# Patient Record
Sex: Male | Born: 1970 | Race: Black or African American | Hispanic: No | Marital: Married | Smoking: Never smoker
Health system: Southern US, Community
[De-identification: ages and names within clinical notes are randomized; demographics above are authoritative.]

## PROBLEM LIST (undated history)

## (undated) DIAGNOSIS — M79601 Pain in right arm: Secondary | ICD-10-CM

## (undated) DIAGNOSIS — M25511 Pain in right shoulder: Secondary | ICD-10-CM

## (undated) DIAGNOSIS — M79602 Pain in left arm: Secondary | ICD-10-CM

## (undated) DIAGNOSIS — M25512 Pain in left shoulder: Secondary | ICD-10-CM

---

## 2015-10-24 ENCOUNTER — Encounter (HOSPITAL_COMMUNITY): Payer: Self-pay | Admitting: *Deleted

## 2015-10-24 ENCOUNTER — Emergency Department (INDEPENDENT_AMBULATORY_CARE_PROVIDER_SITE_OTHER)
Admission: EM | Admit: 2015-10-24 | Discharge: 2015-10-24 | Disposition: A | Discharge status: Home / Self Care | Payer: Self-pay | Source: Home / Self Care

## 2015-10-24 DIAGNOSIS — Z041 Encounter for examination and observation following transport accident: Secondary | ICD-10-CM

## 2015-10-24 MED ORDER — IBUPROFEN 800 MG PO TABS: 800.0000 mg | ORAL_TABLET | Freq: Three times a day (TID) | ORAL | Status: DC
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
## 2015-11-26 ENCOUNTER — Observation Stay (HOSPITAL_COMMUNITY)
Admission: EM | Admit: 2015-11-26 | Discharge: 2015-11-27 | Disposition: A | Discharge status: Home / Self Care | Payer: Medicaid Other | Attending: Internal Medicine | Admitting: Internal Medicine

## 2015-11-26 ENCOUNTER — Encounter (HOSPITAL_COMMUNITY): Payer: Self-pay | Admitting: *Deleted

## 2015-11-26 ENCOUNTER — Observation Stay (HOSPITAL_COMMUNITY): Payer: Medicaid Other

## 2015-11-26 DIAGNOSIS — M791 Myalgia, unspecified site: Secondary | ICD-10-CM

## 2015-11-26 DIAGNOSIS — R7989 Other specified abnormal findings of blood chemistry: Secondary | ICD-10-CM | POA: Insufficient documentation

## 2015-11-26 DIAGNOSIS — R778 Other specified abnormalities of plasma proteins: Secondary | ICD-10-CM

## 2015-11-26 DIAGNOSIS — M79601 Pain in right arm: Secondary | ICD-10-CM | POA: Diagnosis present

## 2015-11-26 DIAGNOSIS — R079 Chest pain, unspecified: Secondary | ICD-10-CM | POA: Diagnosis present

## 2015-11-26 DIAGNOSIS — R52 Pain, unspecified: Secondary | ICD-10-CM | POA: Diagnosis present

## 2015-11-26 DIAGNOSIS — H6123 Impacted cerumen, bilateral: Secondary | ICD-10-CM

## 2015-11-26 DIAGNOSIS — M79602 Pain in left arm: Secondary | ICD-10-CM

## 2015-11-26 DIAGNOSIS — I10 Essential (primary) hypertension: Secondary | ICD-10-CM | POA: Diagnosis present

## 2015-11-26 HISTORY — DX: Pain in left arm: M79.602

## 2015-11-26 HISTORY — DX: Pain in right arm: M79.601

## 2015-11-26 HISTORY — DX: Pain in left shoulder: M25.512

## 2015-11-26 HISTORY — DX: Pain in right shoulder: M25.511

## 2015-11-26 LAB — LIPID PANEL
CHOL/HDL RATIO: 6.1 ratio
Cholesterol: 270 mg/dL — ABNORMAL HIGH (ref 0–200)
HDL: 44 mg/dL (ref >40)
LDL CALC: 195 mg/dL — AB (ref 0–99)
Triglycerides: 156 mg/dL — ABNORMAL HIGH (ref <150)
VLDL: 31 mg/dL (ref 0–40)

## 2015-11-26 LAB — CBC WITH DIFFERENTIAL/PLATELET
Basophils Absolute: 0 10*3/uL (ref 0.0–0.1)
Basophils Relative: 1 %
EOS PCT: 8 %
Eosinophils Absolute: 0.5 10*3/uL (ref 0.0–0.7)
HCT: 44.9 % (ref 39.0–52.0)
HEMOGLOBIN: 15.3 g/dL (ref 13.0–17.0)
LYMPHS ABS: 3.1 10*3/uL (ref 0.7–4.0)
LYMPHS PCT: 51 %
MCH: 29.4 pg (ref 26.0–34.0)
MCHC: 34.1 g/dL (ref 30.0–36.0)
MCV: 86.2 fL (ref 78.0–100.0)
MONOS PCT: 9 %
Monocytes Absolute: 0.5 10*3/uL (ref 0.1–1.0)
NEUTROS PCT: 31 %
Neutro Abs: 1.9 10*3/uL (ref 1.7–7.7)
Platelets: 226 10*3/uL (ref 150–400)
RBC: 5.21 MIL/uL (ref 4.22–5.81)
RDW: 12.7 % (ref 11.5–15.5)
WBC: 6.1 10*3/uL (ref 4.0–10.5)

## 2015-11-26 LAB — COMPREHENSIVE METABOLIC PANEL
ALK PHOS: 52 U/L (ref 38–126)
ALT: 19 U/L (ref 17–63)
ANION GAP: 9 (ref 5–15)
AST: 33 U/L (ref 15–41)
Albumin: 3.7 g/dL (ref 3.5–5.0)
BILIRUBIN TOTAL: 1.1 mg/dL (ref 0.3–1.2)
BUN: 8 mg/dL (ref 6–20)
CALCIUM: 9.5 mg/dL (ref 8.9–10.3)
CO2: 27 mmol/L (ref 22–32)
CREATININE: 0.8 mg/dL (ref 0.61–1.24)
Chloride: 101 mmol/L (ref 101–111)
Glucose, Bld: 85 mg/dL (ref 65–99)
Potassium: 4.3 mmol/L (ref 3.5–5.1)
Sodium: 137 mmol/L (ref 135–145)
TOTAL PROTEIN: 8 g/dL (ref 6.5–8.1)

## 2015-11-26 LAB — URINALYSIS, ROUTINE W REFLEX MICROSCOPIC
BILIRUBIN URINE: NEGATIVE
Glucose, UA: NEGATIVE mg/dL
HGB URINE DIPSTICK: NEGATIVE
KETONES UR: NEGATIVE mg/dL
Leukocytes, UA: NEGATIVE
NITRITE: NEGATIVE
PROTEIN: NEGATIVE mg/dL
SPECIFIC GRAVITY, URINE: 1.007 (ref 1.005–1.030)
pH: 7.5 (ref 5.0–8.0)

## 2015-11-26 LAB — TROPONIN I
TROPONIN I: 0.04 ng/mL — AB (ref <0.031)
Troponin I: <0.03 ng/mL (ref <0.031)

## 2015-11-26 LAB — CK: CK TOTAL: 162 U/L (ref 49–397)

## 2015-11-26 MED ORDER — ONDANSETRON HCL 4 MG/2ML IJ SOLN: 4.0000 mg | Freq: Four times a day (QID) | INTRAMUSCULAR | Status: DC | PRN

## 2015-11-26 MED ORDER — ACETAMINOPHEN 325 MG PO TABS: 650.0000 mg | ORAL_TABLET | ORAL | Status: DC | PRN

## 2015-11-26 MED ORDER — KETOROLAC TROMETHAMINE 30 MG/ML IJ SOLN
30.0000 mg | Freq: Once | INTRAMUSCULAR | Status: AC
  Administered 2015-11-26: 30 mg via INTRAVENOUS
  Filled 2015-11-26: qty 1

## 2015-11-26 MED ORDER — ENOXAPARIN SODIUM 40 MG/0.4ML ~~LOC~~ SOLN: 40.0000 mg | SUBCUTANEOUS | Status: DC

## 2015-11-26 MED ORDER — IBUPROFEN 200 MG PO TABS
600.0000 mg | ORAL_TABLET | Freq: Once | ORAL | Status: AC
  Administered 2015-11-26: 600 mg via ORAL
  Filled 2015-11-26: qty 1

## 2015-11-26 MED ORDER — IBUPROFEN 600 MG PO TABS: 600.0000 mg | ORAL_TABLET | Freq: Three times a day (TID) | ORAL | Status: DC | PRN

## 2015-11-26 MED ORDER — NITROGLYCERIN 0.4 MG SL SUBL: 0.4000 mg | SUBLINGUAL_TABLET | SUBLINGUAL | Status: DC | PRN

## 2015-11-26 MED ORDER — ENOXAPARIN SODIUM 40 MG/0.4ML ~~LOC~~ SOLN
40.0000 mg | SUBCUTANEOUS | Status: DC
  Administered 2015-11-26: 40 mg via SUBCUTANEOUS
  Filled 2015-11-26: qty 0.4

## 2015-11-26 MED ORDER — GI COCKTAIL ~~LOC~~: 30.0000 mL | Freq: Three times a day (TID) | ORAL | Status: DC | PRN

## 2015-11-26 MED ORDER — SODIUM CHLORIDE 0.9 % IV SOLN
INTRAVENOUS | Status: AC
  Administered 2015-11-26: 18:00:00 via INTRAVENOUS

## 2015-11-26 MED ORDER — MORPHINE SULFATE (PF) 2 MG/ML IV SOLN: 1.0000 mg | INTRAVENOUS | Status: DC | PRN

## 2015-11-26 MED ORDER — MORPHINE SULFATE (PF) 2 MG/ML IV SOLN: 2.0000 mg | INTRAVENOUS | Status: DC | PRN

## 2015-11-26 MED ORDER — HYDROCODONE-ACETAMINOPHEN 5-325 MG PO TABS: 1.0000 | ORAL_TABLET | ORAL | Status: DC | PRN

## 2015-11-26 MED ORDER — ASPIRIN 81 MG PO CHEW
324.0000 mg | CHEWABLE_TABLET | Freq: Once | ORAL | Status: AC
  Administered 2015-11-26: 324 mg via ORAL
  Filled 2015-11-26: qty 4

## 2015-11-26 MED ORDER — HYDRALAZINE HCL 20 MG/ML IJ SOLN: 2.0000 mg | INTRAMUSCULAR | Status: DC | PRN

## 2015-11-26 MED ORDER — ALPRAZOLAM 0.25 MG PO TABS: 0.2500 mg | ORAL_TABLET | Freq: Two times a day (BID) | ORAL | Status: DC | PRN

## 2015-11-26 NOTE — ED Notes (Signed)
Pt reports generalized pain x 3 days, occurs more in neck ears and shoulders. Denies any other symptoms. No acute distress noted.

## 2015-11-26 NOTE — ED Notes (Signed)
Rep[ort called to  chrissie rn 3 w

## 2015-11-26 NOTE — ED Provider Notes (Signed)
CSN: [id number]     Arrival date & time 11/26/15  1003 History   First MD Initiated Contact with Patient 11/26/15 1227     Chief Complaint  Patient presents with  . Pain     (Consider location/radiation/quality/duration/timing/severity/associated sxs/prior Treatment) HPI Comments: Patient is a previously healthy 45 year old male who presents with muscle soreness in his arms and shoulders. The patient reports his muscle pain and soreness. The patient began a new job with heavy lifting in March. He states he has felt muscle soreness since he began, but the past 3 days have been a little worse. He rates his pain as a 5/10. The patient has tried soaking in warm water at home which has been helpful. He has not tried any medications. He states that he does not have pain when he is working, but it returns when he gets home and is at rest. The patient also reports increased earwax in pain on the inside of his ears when he has to wear ear protection at work. The patient denies any chest pain, shortness of breath, abdominal pain, nausea, vomiting, dysuria.  The history is provided by the patient. The history is limited by a language barrier. A language interpreter was used.    History reviewed. No pertinent past medical history. History reviewed. No pertinent past surgical history. History reviewed. No pertinent family history. Social History  Substance Use Topics  . Smoking status: Never Smoker   . Smokeless tobacco: None  . Alcohol Use: No    Review of Systems  Constitutional: Negative for fever and chills.  HENT: Positive for ear pain. Negative for facial swelling and sore throat.   Respiratory: Negative for shortness of breath.   Cardiovascular: Negative for chest pain.  Gastrointestinal: Negative for nausea, vomiting and abdominal pain.  Genitourinary: Negative for dysuria.  Musculoskeletal: Positive for myalgias. Negative for back pain.  Skin: Negative for rash and wound.  Neurological:  Negative for headaches.  Psychiatric/Behavioral: The patient is not nervous/anxious.       Allergies  Review of patient's allergies indicates no known allergies.  Home Medications   Prior to Admission medications   Medication Sig Start Date End Date Taking? Authorizing Provider  ibuprofen (ADVIL,MOTRIN) 600 MG tablet Take 1 tablet (600 mg total) by mouth every 8 (eight) hours as needed. 11/26/15   Axle Parfait M Aleeza [place], PA-C   BP 144/96 mmHg  Pulse 61  Temp(Src) 98.3 F (36.8 C) (Oral)  Resp 16  SpO2 97% Physical Exam  Constitutional: He appears well-developed and well-nourished. No distress.  HENT:  Head: Normocephalic and atraumatic.  Right Ear: Tympanic membrane normal.  Left Ear: Tympanic membrane normal.  Mouth/Throat: Oropharynx is clear and moist. No oropharyngeal exudate.  Large amounts of hard cerumen in bilateral ear canals; removed with curette and TMs normal bilaterally  Eyes: Conjunctivae are normal. Pupils are equal, round, and reactive to light. Right eye exhibits no discharge. Left eye exhibits no discharge. No scleral icterus.  Neck: Normal range of motion. Neck supple. No thyromegaly present.  Cardiovascular: Normal rate, regular rhythm and normal heart sounds.  Exam reveals no gallop and no friction rub.   No murmur heard. Pulmonary/Chest: Effort normal and breath sounds normal. No stridor. No respiratory distress. He has no wheezes. He has no rales.  Abdominal: Soft. Bowel sounds are normal. He exhibits no distension. There is no tenderness. There is no rebound and no guarding.  Musculoskeletal: He exhibits no edema.       Thoracic back: He  exhibits no tenderness.       Arms: Patient states he has muscle soreness in his biceps and over the area of his latissimus muscles; patient has normal range of motion, no bony tenderness; denies back pain or tenderness on palpation  Lymphadenopathy:    He has no cervical adenopathy.  Neurological: He is alert. Coordination  normal.  Skin: Skin is warm and dry. No rash noted. He is not diaphoretic. No pallor.  Psychiatric: He has a normal mood and affect.  Nursing note and vitals reviewed.   ED Course  Procedures (including critical care time) Labs Review Labs Reviewed  TROPONIN I - Abnormal; Notable for the following:    Troponin I 0.04 (*)    All other components within normal limits  CBC WITH DIFFERENTIAL/PLATELET  COMPREHENSIVE METABOLIC PANEL  CK  URINALYSIS, ROUTINE W REFLEX MICROSCOPIC (NOT AT [facility])  TROPONIN I  LIPID PANEL    Imaging Review No results found. I have personally reviewed and evaluated these images and lab results as part of my medical decision-making.   EKG Interpretation   Date/Time:  Sunday November 26 2015 14:31:14 EDT Ventricular Rate:  57 PR Interval:  177 QRS Duration: 83 QT Interval:  377 QTC Calculation: 367 R Axis:   61 Text Interpretation:  Sinus rhythm Probable left atrial enlargement  Probable left ventricular hypertrophy ST elev, probable normal early repol  pattern No previous ECGs available Confirmed by RANCOUR  MD, STEPHEN  ([id number]) on 11/26/2015 2:44:05 PM      MDM   CBC, CMP, CK unremarkable.Troponin 0.04. EKG shows NSR, probable left atrial enlargement and left ventricular hypertrophy, ST elevation, but probable normal early repol.  Patient given ibuprofen in ED. Cerumen disimpaction completed in the ED. Supportive care discussed for muscle soreness and prevention of cerumen impactions. Dr. Manus Gunningancour consulted Dr. Elvera LennoxGherghe with medcine who would like to admit the patient for observation and serial troponins. Patient also evaluated by Dr. Manus Gunningancour who is in agreement with plan. Patient understands and is in agreement with plan.   Final diagnoses:  Muscle soreness  Cerumen impaction, bilateral  Elevated troponin I level      Emi Holeslexandra M Rula [place], PA-C 11/26/15 [address]1645  Katesha Eichel M Antawn Sison, PA-C 11/26/15 1744  Glynn OctaveStephen Rancour, MD 11/26/15 2252

## 2015-11-26 NOTE — ED Notes (Signed)
Used Music therapisttranslator phone for [employer]wahili. Pt states he has pain in his arms and shoulders due to heavy lifting at his job. No injures to arms, he jsut feels weak. No obvious bruising or deformity, full range of motion noted. Grip strength strong and equal. Pt also c/o "bloody discharged" to both ears. No drainaged noted at this time to ears.

## 2015-11-26 NOTE — ED Notes (Signed)
Unsuccessful attempts x 2 to start iv
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
## 2015-11-27 ENCOUNTER — Encounter (HOSPITAL_COMMUNITY): Payer: Self-pay | Admitting: Internal Medicine

## 2015-11-27 ENCOUNTER — Other Ambulatory Visit (HOSPITAL_COMMUNITY): Payer: Medicaid Other

## 2015-11-27 DIAGNOSIS — R072 Precordial pain: Secondary | ICD-10-CM

## 2015-11-27 DIAGNOSIS — R079 Chest pain, unspecified: Secondary | ICD-10-CM

## 2015-11-27 DIAGNOSIS — R7989 Other specified abnormal findings of blood chemistry: Secondary | ICD-10-CM | POA: Diagnosis not present

## 2015-11-27 LAB — TROPONIN I

## 2015-11-27 MED ORDER — HYDROCODONE-ACETAMINOPHEN 5-325 MG PO TABS: 1.0000 | ORAL_TABLET | ORAL | Status: AC | PRN

## 2015-11-27 MED ORDER — ATORVASTATIN CALCIUM 10 MG PO TABS: 10.0000 mg | ORAL_TABLET | Freq: Every day | ORAL | Status: DC

## 2015-11-27 MED ORDER — ALPRAZOLAM 0.25 MG PO TABS: 0.2500 mg | ORAL_TABLET | Freq: Two times a day (BID) | ORAL | Status: DC | PRN

## 2015-11-27 NOTE — Progress Notes (Signed)
Pt discharged to home, ambulatory, accompanied by family member, all discharge instructions given with assistance of interpreter line with verbal understanding returned by pt, questions answered to pts satisfaction, paper prescriptions given.  Raymon MuttonGwen Tandrea Kommer RN
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
## 2015-11-27 NOTE — Care Management Note (Signed)
Case Management Note  Patient Details  Name: Robert Mcintosh MRN: [id number] Date of Birth: 1970-08-04  Subjective/Objective:  Pt in for CP elevated troponin.                  Action/Plan: CM received referral for PCP needs. Pt is active with the [facility]. Unit Secretary to set up an appointment for pt. No further needs from CM at this time.    Expected Discharge Date:                  Expected Discharge Plan:  Home/Self Care  In-House Referral:  NA  Discharge planning Services  CM Consult, Follow-up appt scheduled, [facility]  Post Acute Care Choice:  NA Choice offered to:  NA  DME Arranged:  N/A DME Agency:  NA  HH Arranged:  NA HH Agency:  NA  Status of Service:  Completed, signed off  Medicare Important Message Given:    Date Medicare IM Given:    Medicare IM give by:    Date Additional Medicare IM Given:    Additional Medicare Important Message give by:     If discussed at Long Length of Stay Meetings, dates discussed:    Additional Comments:  Gala LewandowskyGraves-Bigelow, Annleigh Knueppel Kaye, RN 11/27/2015, 11:34 AM

## 2015-11-27 NOTE — Discharge Instructions (Signed)
Medications: Ibuprofen  Treatment: Take ibuprofen as needed every 8 hours for your muscle soreness. You may use moist heat for ice, 20 minutes on, 20 minutes off, on areas of muscle soreness. Make sure you are stretching before and after your heavy lifting. For your earwax, put one to 2 drops of olive in your ears at night to prevent hardening of your earwax.  Follow-up: Please follow-up with the wellness Center mentioned in your discharge paperwork for follow-up of today's visit and to establish care. Please return to the emergency department if you develop worsening pain, or any new or concerning symptoms.   Muscle Pain, Adult Muscle pain (myalgia) may be caused by many things, including:  Overuse or muscle strain, especially if you are not in shape. This is the most common cause of muscle pain.  Injury.  Bruises.  Viruses, such as the flu.  Infectious diseases.  Fibromyalgia, which is a chronic condition that causes muscle tenderness, fatigue, and headache.  Autoimmune diseases, including lupus.  Certain drugs, including ACE inhibitors and statins. Muscle pain may be mild or severe. In most cases, the pain lasts only a short time and goes away without treatment. To diagnose the cause of your muscle pain, your health care provider will take your medical history. This means he or she will ask you when your muscle pain began and what has been happening. If you have not had muscle pain for very long, your health care provider may want to wait before doing much testing. If your muscle pain has lasted a long time, your health care provider may want to run tests right away. If your health care provider thinks your muscle pain may be caused by illness, you may need to have additional tests to rule out certain conditions.  Treatment for muscle pain depends on the cause. Home care is often enough to relieve muscle pain. Your health care provider may also prescribe anti-inflammatory medicine. HOME  CARE INSTRUCTIONS Watch your condition for any changes. The following actions may help to lessen any discomfort you are feeling:  Only take over-the-counter or prescription medicines as directed by your health care provider.  Apply ice to the sore muscle:  Put ice in a plastic bag.  Place a towel between your skin and the bag.  Leave the ice on for 15-20 minutes, 3-4 times a day.  You may alternate applying hot and cold packs to the muscle as directed by your health care provider.  If overuse is causing your muscle pain, slow down your activities until the pain goes away.  Remember that it is normal to feel some muscle pain after starting a workout program. Muscles that have not been used often will be sore at first.  Do regular, gentle exercises if you are not usually active.  Warm up before exercising to lower your risk of muscle pain.  Do not continue working out if the pain is very bad. Bad pain could mean you have injured a muscle. SEEK MEDICAL CARE IF:  Your muscle pain gets worse, and medicines do not help.  You have muscle pain that lasts longer than 3 days.  You have a rash or fever along with muscle pain.  You have muscle pain after a tick bite.  You have muscle pain while working out, even though you are in good physical condition.  You have redness, soreness, or swelling along with muscle pain.  You have muscle pain after starting a new medicine or changing the dose of  a medicine. SEEK IMMEDIATE MEDICAL CARE IF:  You have trouble breathing.  You have trouble swallowing.  You have muscle pain along with a stiff neck, fever, and vomiting.  You have severe muscle weakness or cannot move part of your body. MAKE SURE YOU:   Understand these instructions.  Will watch your condition.  Will get help right away if you are not doing well or get worse.   This information is not intended to replace advice given to you by your health care provider. Make sure you  discuss any questions you have with your health care provider.   Document Released: 06/06/2006 Document Revised: 08/05/2014 Document Reviewed: 05/11/2013 Elsevier Interactive Patient Education [employer]
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
## 2015-12-06 ENCOUNTER — Inpatient Hospital Stay: Payer: Medicaid Other | Admitting: Internal Medicine

## 2016-01-23 ENCOUNTER — Telehealth: Payer: Self-pay | Admitting: *Deleted

## 2016-01-23 NOTE — Telephone Encounter (Signed)
Called for appointment

## 2016-03-15 ENCOUNTER — Ambulatory Visit: Payer: Medicaid Other | Attending: Family Medicine | Admitting: Family Medicine

## 2016-03-15 ENCOUNTER — Encounter: Payer: Self-pay | Admitting: Family Medicine

## 2016-03-15 DIAGNOSIS — Z79899 Other long term (current) drug therapy: Secondary | ICD-10-CM | POA: Diagnosis not present

## 2016-03-15 DIAGNOSIS — M7592 Shoulder lesion, unspecified, left shoulder: Secondary | ICD-10-CM | POA: Insufficient documentation

## 2016-03-15 DIAGNOSIS — E785 Hyperlipidemia, unspecified: Secondary | ICD-10-CM | POA: Insufficient documentation

## 2016-03-15 DIAGNOSIS — M7582 Other shoulder lesions, left shoulder: Secondary | ICD-10-CM | POA: Diagnosis not present

## 2016-03-15 DIAGNOSIS — M7580 Other shoulder lesions, unspecified shoulder: Secondary | ICD-10-CM | POA: Insufficient documentation

## 2016-03-15 MED ORDER — PREDNISONE 20 MG PO TABS: 20.0000 mg | ORAL_TABLET | Freq: Two times a day (BID) | ORAL | 0 refills | Status: DC

## 2016-03-15 MED ORDER — MELOXICAM 7.5 MG PO TABS: 7.5000 mg | ORAL_TABLET | Freq: Every day | ORAL | 2 refills | Status: DC

## 2016-03-15 MED ORDER — ATORVASTATIN CALCIUM 10 MG PO TABS: 10.0000 mg | ORAL_TABLET | Freq: Every day | ORAL | 2 refills | Status: DC

## 2016-03-15 MED FILL — MELOXICAM 7.5 MG TABLET: 7.5 | 30 days supply | Qty: 30 | Fill #0

## 2016-03-15 MED FILL — ATORVASTATIN 10 MG TABLET: 10 | 30 days supply | Qty: 30 | Fill #0

## 2016-03-15 MED FILL — predniSONE 20 MG TABS: 20 | 5 days supply | Qty: 10 | Fill #0
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
## 2016-04-15 ENCOUNTER — Encounter: Payer: Self-pay | Admitting: Family Medicine

## 2016-04-15 ENCOUNTER — Ambulatory Visit: Payer: Medicaid Other | Attending: Family Medicine | Admitting: Family Medicine

## 2016-04-15 VITALS — BP 126/80 | HR 64 | Temp 98.0°F | Wt 146.0 lb

## 2016-04-15 DIAGNOSIS — E785 Hyperlipidemia, unspecified: Secondary | ICD-10-CM

## 2016-04-15 DIAGNOSIS — M7582 Other shoulder lesions, left shoulder: Secondary | ICD-10-CM

## 2016-04-15 DIAGNOSIS — Z23 Encounter for immunization: Secondary | ICD-10-CM | POA: Diagnosis not present

## 2016-04-15 MED ORDER — ATORVASTATIN CALCIUM 10 MG PO TABS: 10.0000 mg | ORAL_TABLET | Freq: Every day | ORAL | 2 refills | Status: DC

## 2016-04-15 MED ORDER — MELOXICAM 7.5 MG PO TABS: 7.5000 mg | ORAL_TABLET | Freq: Every day | ORAL | 2 refills | Status: DC

## 2016-04-15 NOTE — Progress Notes (Signed)
Pt is here today for the following:  Follow up for left shoulder pain Pt states his pain is low now.

## 2016-04-15 NOTE — Patient Instructions (Signed)
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
## 2016-11-19 ENCOUNTER — Telehealth: Payer: Self-pay

## 2016-11-19 NOTE — Telephone Encounter (Signed)
Client came in to the clinic requesting assistance with making an appointment with PCP. BP checked 130/79 [facility] and wellness contacted and appointment was made for May 11th  @ 0830 [facility] RN/Heena Naliya Gish RN [phone]

## 2016-12-06 ENCOUNTER — Ambulatory Visit (HOSPITAL_COMMUNITY)
Admission: RE | Admit: 2016-12-06 | Discharge: 2016-12-06 | Disposition: A | Discharge status: Home / Self Care | Payer: Self-pay | Source: Ambulatory Visit | Attending: Family Medicine | Admitting: Family Medicine

## 2016-12-06 ENCOUNTER — Ambulatory Visit: Payer: Medicaid Other | Attending: Family Medicine | Admitting: Family Medicine

## 2016-12-06 VITALS — BP 111/70 | HR 64 | Temp 97.9°F | Resp 18 | Ht 66.0 in | Wt 142.4 lb

## 2016-12-06 DIAGNOSIS — G8929 Other chronic pain: Secondary | ICD-10-CM | POA: Insufficient documentation

## 2016-12-06 DIAGNOSIS — Z23 Encounter for immunization: Secondary | ICD-10-CM

## 2016-12-06 DIAGNOSIS — I10 Essential (primary) hypertension: Secondary | ICD-10-CM | POA: Insufficient documentation

## 2016-12-06 DIAGNOSIS — Z79899 Other long term (current) drug therapy: Secondary | ICD-10-CM | POA: Insufficient documentation

## 2016-12-06 DIAGNOSIS — R202 Paresthesia of skin: Secondary | ICD-10-CM | POA: Insufficient documentation

## 2016-12-06 DIAGNOSIS — M25512 Pain in left shoulder: Secondary | ICD-10-CM | POA: Insufficient documentation

## 2016-12-06 DIAGNOSIS — Z0000 Encounter for general adult medical examination without abnormal findings: Secondary | ICD-10-CM

## 2016-12-06 DIAGNOSIS — E785 Hyperlipidemia, unspecified: Secondary | ICD-10-CM | POA: Insufficient documentation

## 2016-12-06 DIAGNOSIS — M79609 Pain in unspecified limb: Secondary | ICD-10-CM

## 2016-12-06 DIAGNOSIS — Z87828 Personal history of other (healed) physical injury and trauma: Secondary | ICD-10-CM

## 2016-12-06 DIAGNOSIS — Z8639 Personal history of other endocrine, nutritional and metabolic disease: Secondary | ICD-10-CM

## 2016-12-06 DIAGNOSIS — Z8679 Personal history of other diseases of the circulatory system: Secondary | ICD-10-CM

## 2016-12-06 LAB — POCT UA - MICROALBUMIN
CREATININE, POC: 300 mg/dL
Microalbumin Ur, POC: 30 mg/L

## 2016-12-06 MED ORDER — MELOXICAM 7.5 MG PO TABS: 7.5000 mg | ORAL_TABLET | Freq: Every day | ORAL | 2 refills | Status: AC

## 2016-12-06 MED ORDER — ACETAMINOPHEN 500 MG PO TABS: 1000.0000 mg | ORAL_TABLET | Freq: Four times a day (QID) | ORAL | 0 refills | Status: AC | PRN

## 2016-12-06 NOTE — Progress Notes (Signed)
Patient is here for establish care  Patient complains about left shoulder/arm pain that comes & goes   Patient is not on any  current medication  Patient ha snot eaten for today
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
## 2016-12-06 NOTE — Patient Instructions (Signed)
Shoulder Pain Many things can cause shoulder pain, including:  An injury.  Moving the arm in the same way again and again (overuse).  Joint pain (arthritis). Follow these instructions at home: Take these actions to help with your pain:  Squeeze a soft ball or a foam pad as much as you can. This helps to prevent swelling. It also makes the arm stronger.  Take over-the-counter and prescription medicines only as told by your doctor.  If told, put ice on the area:  Put ice in a plastic bag.  Place a towel between your skin and the bag.  Leave the ice on for 20 minutes, 2-3 times per day. Stop putting on ice if it does not help with the pain.  If you were given a shoulder sling or immobilizer:  Wear it as told.  Remove it to shower or bathe.  Move your arm as little as possible.  Keep your hand moving. This helps prevent swelling. Contact a doctor if:  Your pain gets worse.  Medicine does not help your pain.  You have new pain in your arm, hand, or fingers. Get help right away if:  Your arm, hand, or fingers:  Tingle.  Are numb.  Are swollen.  Are painful.  Turn white or blue. This information is not intended to replace advice given to you by your health care provider. Make sure you discuss any questions you have with your health care provider. Document Released: 01/01/2008 Document Revised: 03/10/2016 Document Reviewed: 11/07/2014 Elsevier Interactive Patient Education  2017 [employer]  

## 2016-12-07 LAB — CMP14+EGFR
A/G RATIO: 1.3 (ref 1.2–2.2)
ALT: 17 IU/L (ref 0–44)
AST: 28 IU/L (ref 0–40)
Albumin: 4.4 g/dL (ref 3.5–5.5)
Alkaline Phosphatase: 74 IU/L (ref 39–117)
BILIRUBIN TOTAL: 1 mg/dL (ref 0.0–1.2)
BUN / CREAT RATIO: 16 (ref 9–20)
BUN: 12 mg/dL (ref 6–24)
CO2: 25 mmol/L (ref 18–29)
Calcium: 9.7 mg/dL (ref 8.7–10.2)
Chloride: 100 mmol/L (ref 96–106)
Creatinine, Ser: 0.75 mg/dL — ABNORMAL LOW (ref 0.76–1.27)
GFR calc Af Amer: 127 mL/min/{1.73_m2} (ref >59)
GFR, EST NON AFRICAN AMERICAN: 110 mL/min/{1.73_m2} (ref >59)
GLOBULIN, TOTAL: 3.3 g/dL (ref 1.5–4.5)
Glucose: 92 mg/dL (ref 65–99)
POTASSIUM: 4.3 mmol/L (ref 3.5–5.2)
SODIUM: 139 mmol/L (ref 134–144)
TOTAL PROTEIN: 7.7 g/dL (ref 6.0–8.5)

## 2016-12-07 LAB — LIPID PANEL
CHOL/HDL RATIO: 4.1 ratio (ref 0.0–5.0)
Cholesterol, Total: 196 mg/dL (ref 100–199)
HDL: 48 mg/dL (ref >39)
LDL Calculated: 124 mg/dL — ABNORMAL HIGH (ref 0–99)
Triglycerides: 120 mg/dL (ref 0–149)
VLDL Cholesterol Cal: 24 mg/dL (ref 5–40)

## 2016-12-07 LAB — HIV ANTIBODY (ROUTINE TESTING W REFLEX): HIV SCREEN 4TH GENERATION: NONREACTIVE

## 2016-12-16 ENCOUNTER — Other Ambulatory Visit: Payer: Self-pay | Admitting: Family Medicine

## 2016-12-16 DIAGNOSIS — E7849 Other hyperlipidemia: Secondary | ICD-10-CM

## 2016-12-16 MED ORDER — ATORVASTATIN CALCIUM 10 MG PO TABS: 10.0000 mg | ORAL_TABLET | Freq: Every day | ORAL | 1 refills | Status: AC

## 2016-12-17 ENCOUNTER — Telehealth: Payer: Self-pay

## 2016-12-17 NOTE — Telephone Encounter (Signed)
-----   Message from Lizbeth BarkMandesia R Hairston, FNP sent at 12/16/2016  8:18 AM EDT ----- HIV is negative.  Kidney function normal Liver function normal Lipid levels were elevated. This can increase your risk of heart disease. You were prescribed atorvastatin to help lower your risk. Recommend follow up in 6 months.

## 2016-12-17 NOTE — Telephone Encounter (Signed)
Interpreter name Vickki Hearingjohn  Interpreter ID # [id number]  Patient Verify DOB  Interpreter call patient    Patient did not answer & no VM set up

## 2018-03-27 IMAGING — CR DG SHOULDER 2+V*L*
3 series · 3 of 3 positions shown · non-contrast
Comparison: None.

CLINICAL DATA: Left shoulder pain, initial encounter

EXAM:
LEFT SHOULDER - 2+ VIEW

[shoulder grashey]
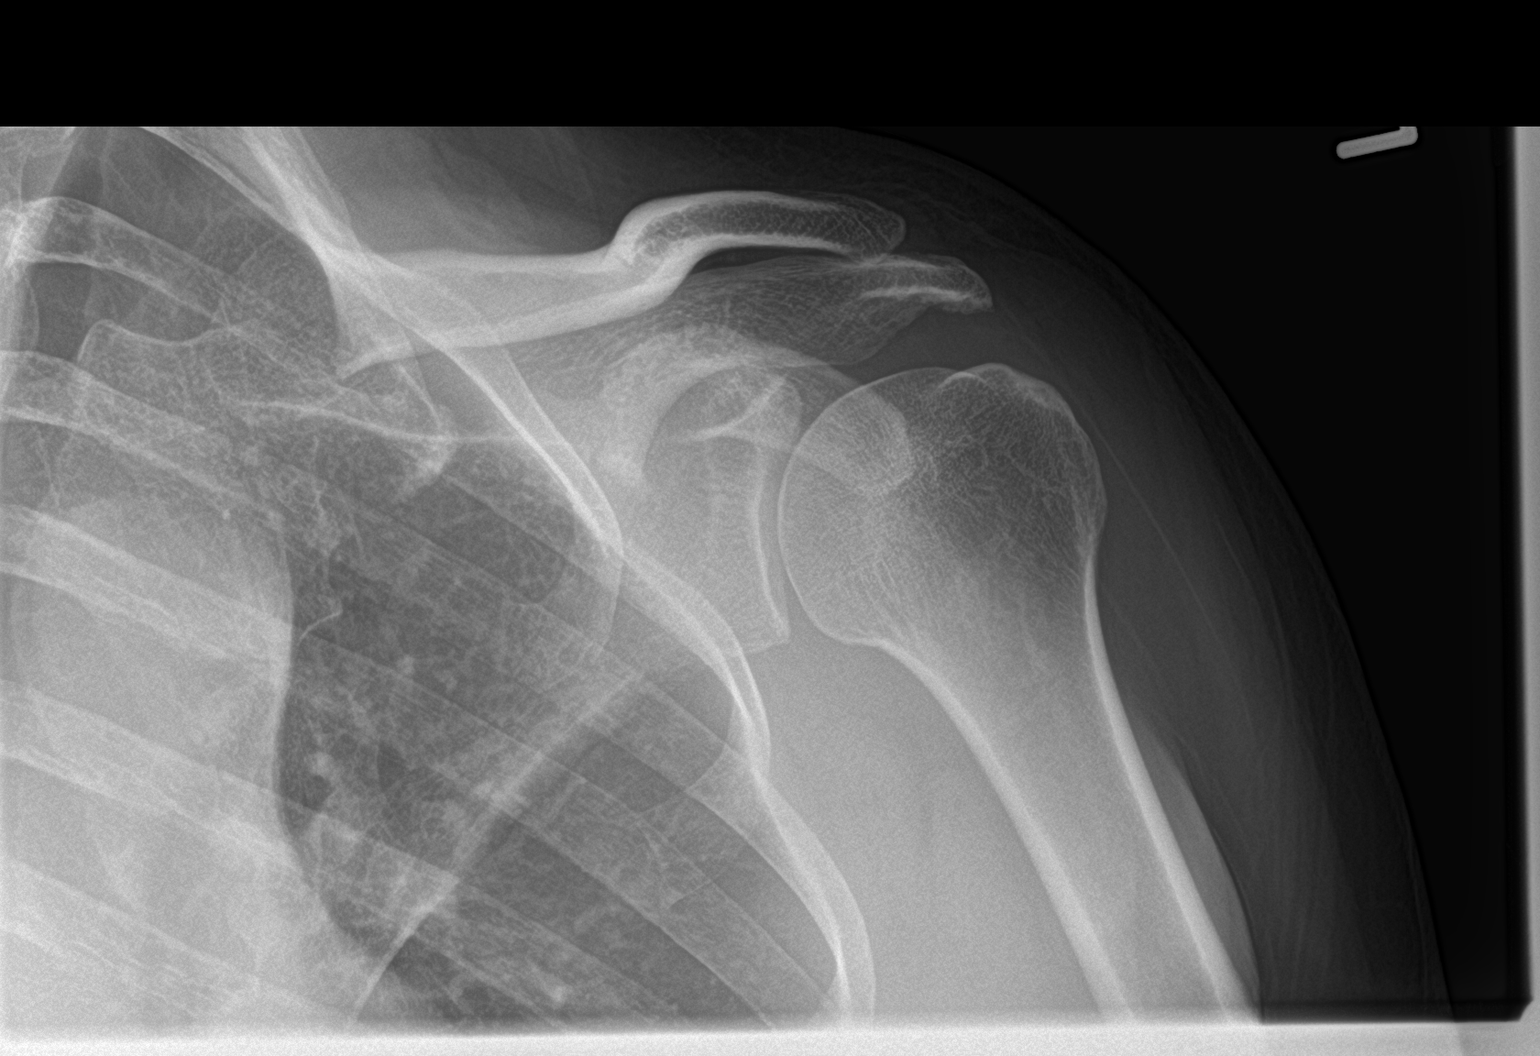

[shoulder axillary]
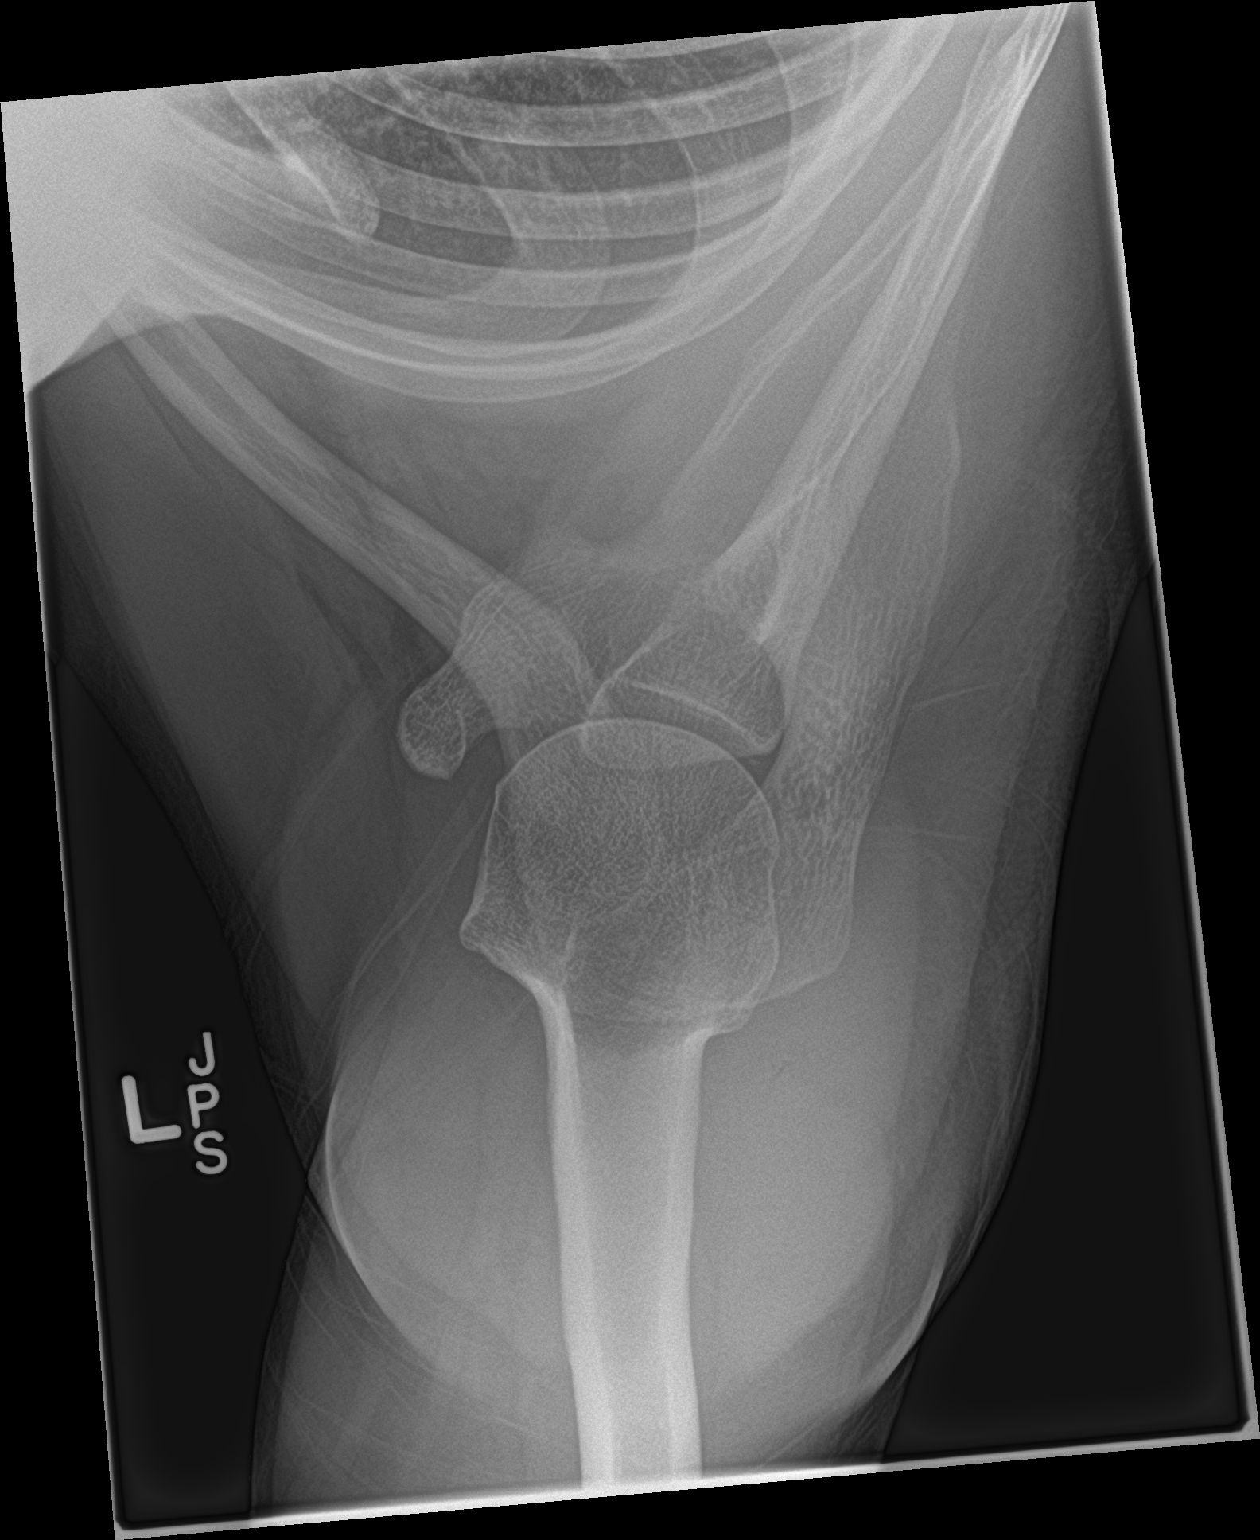

[shoulder y view]
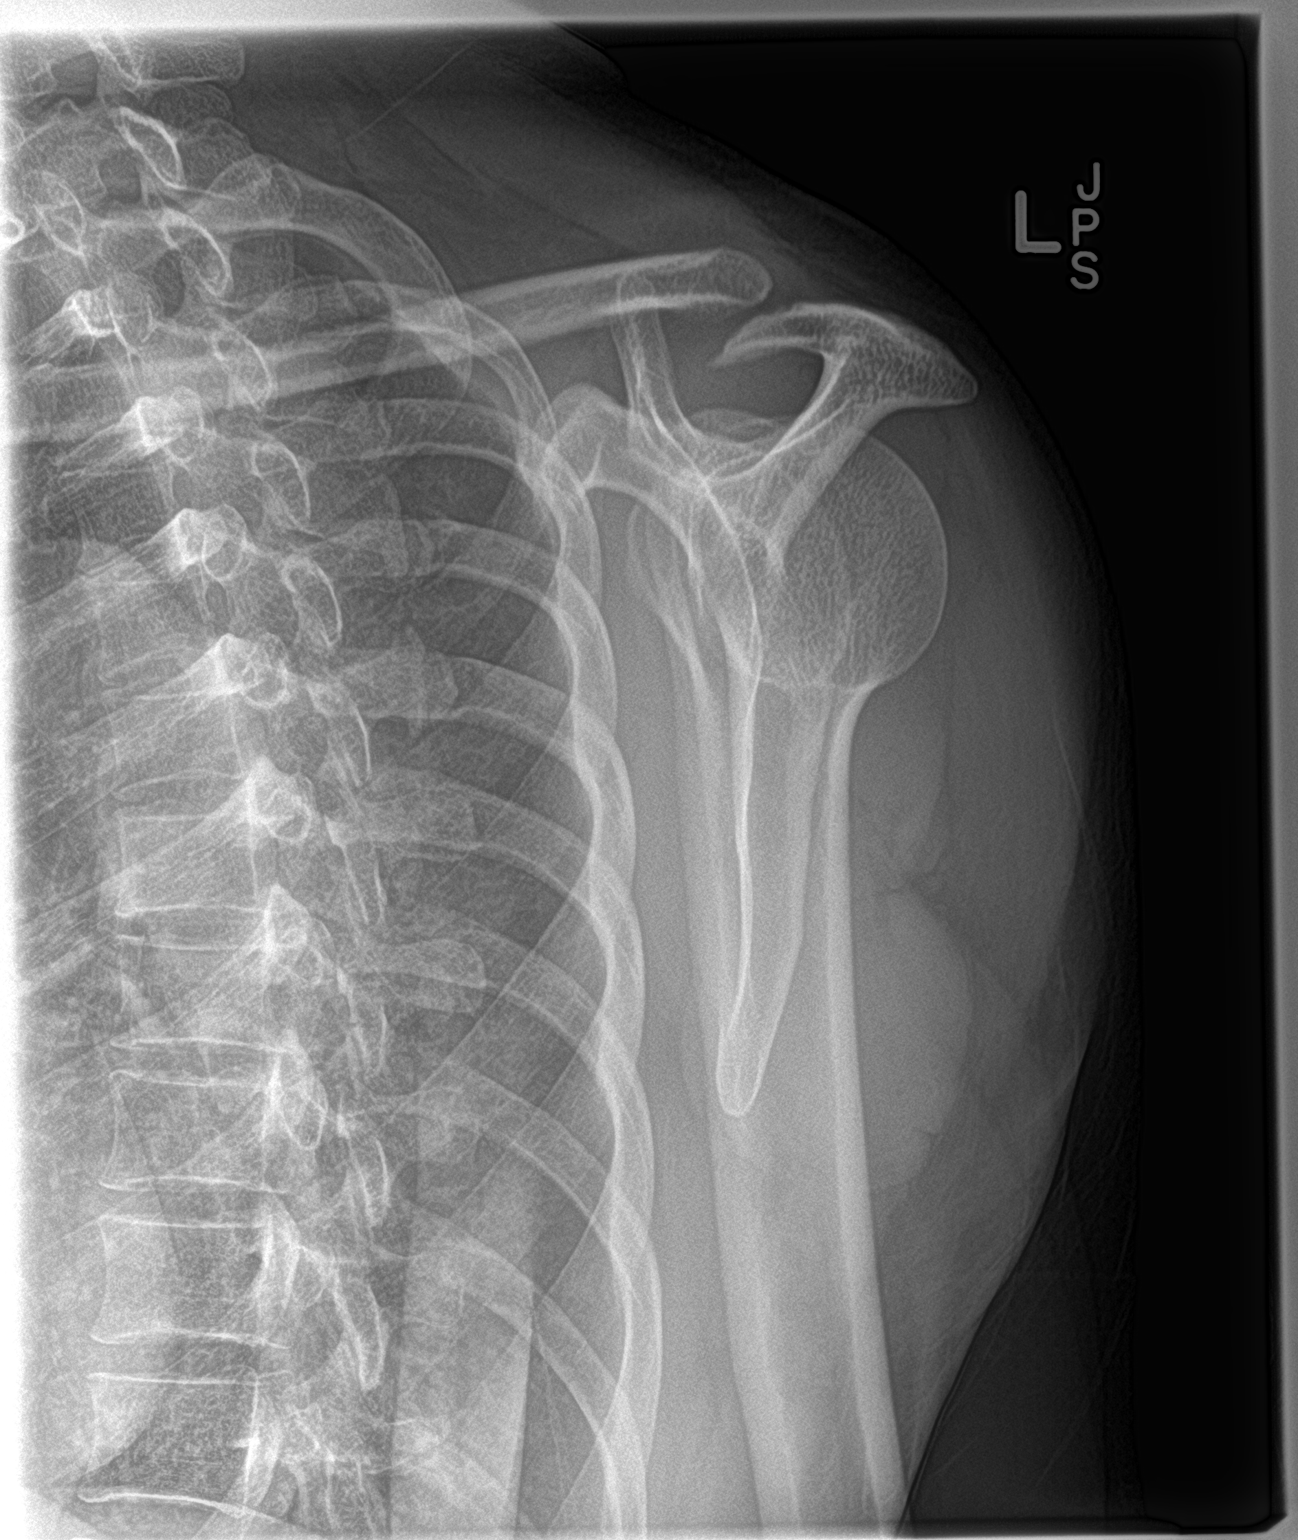

[3 of 3 positions shown; findings below may reference images not displayed]

FINDINGS: There is no evidence of fracture or dislocation. There is no
evidence of arthropathy or other focal bone abnormality. Soft
tissues are unremarkable.
IMPRESSION: No acute abnormality noted.

## 2018-04-14 ENCOUNTER — Ambulatory Visit: Payer: Self-pay | Admitting: Family Medicine

## 2018-04-17 ENCOUNTER — Ambulatory Visit: Payer: Self-pay | Attending: Family Medicine | Admitting: Family Medicine

## 2018-04-17 VITALS — BP 150/75 | HR 76 | Temp 98.0°F | Wt 158.6 lb

## 2018-04-17 DIAGNOSIS — R252 Cramp and spasm: Secondary | ICD-10-CM

## 2018-04-17 DIAGNOSIS — R030 Elevated blood-pressure reading, without diagnosis of hypertension: Secondary | ICD-10-CM

## 2018-04-17 DIAGNOSIS — Z23 Encounter for immunization: Secondary | ICD-10-CM

## 2018-04-17 DIAGNOSIS — R350 Frequency of micturition: Secondary | ICD-10-CM

## 2018-04-17 LAB — POCT URINALYSIS DIP (CLINITEK)
Bilirubin, UA: NEGATIVE
Blood, UA: NEGATIVE
Glucose, UA: NEGATIVE mg/dL
Ketones, POC UA: NEGATIVE mg/dL
Leukocytes, UA: NEGATIVE
Nitrite, UA: NEGATIVE
POC,PROTEIN,UA: NEGATIVE
Spec Grav, UA: 1.01
Urobilinogen, UA: 0.2 U/dL
pH, UA: 5.5

## 2018-04-17 LAB — POCT GLYCOSYLATED HEMOGLOBIN (HGB A1C): HbA1c, POC (prediabetic range): 5.8 % (ref 5.7–6.4)

## 2018-04-17 MED ORDER — SULFAMETHOXAZOLE-TRIMETHOPRIM 800-160 MG PO TABS: 1.0000 | ORAL_TABLET | Freq: Two times a day (BID) | ORAL | 0 refills | Status: AC

## 2018-04-17 NOTE — Progress Notes (Signed)
Subjective:    Patient ID: Robert Mcintosh, male    DOB: [birth year], 47 y.o.   MRN: [id number]   Due to a language barrier, video interpreter service was used at today's visit  HPI 47 year old male who is being seen to re-establish care.  Patient states that his major concern today is that he is having frequent urination.  Patient states that he is having to go to the restroom up to 6 times in a 24-hour period. Patient states that this started about 2 months ago.  Patient denies any burning with urination.  Patient is not sure if he is having any increased thirst.  Patient states that he does drink water throughout the day.  Currently on any prescription medications.  Patient has family history of mother with hypertension and a brother with diabetes.  Patient states that he has been told in the past that his blood pressure was elevated but he has never been on medication to help with his blood pressure.  Patient is having occasional dull headaches in the top of his head.  Patient does not have to take any medications, the headaches go away by themselves after a little while.  Patient denies any blurred vision.  Patient has had no numbness or tingling in his feet.  Patient denies any back pain and no abdominal pain.  Patient has had no chest pain.  Patient does have some fatigue and muscle cramping in his legs after he has been standing for a while.  Patient currently works in [employer].  Patient is married.  Patient denies any use of tobacco or alcohol.  Patient has had no past surgeries.  Patient has had no prior allergies. Past Medical History:  Diagnosis Date  . Bilateral arm pain   . Bilateral shoulder pain    Family History  Problem Relation Age of Onset  . Ulcers Mother    Social History   Tobacco Use  . Smoking status: Never Smoker  . Smokeless tobacco: Never Used  Substance Use Topics  . Alcohol use: No    Alcohol/week: 0.0 standard drinks  . Drug use: No  No Known  Allergies   Review of Systems  Constitutional: Negative for chills, fatigue and fever.  HENT: Negative for sore throat and trouble swallowing.   Respiratory: Negative for cough and shortness of breath.   Cardiovascular: Negative for chest pain, palpitations and leg swelling.  Gastrointestinal: Negative for abdominal pain and nausea.  Genitourinary: Positive for frequency. Negative for dysuria.  Neurological: Negative for dizziness and headaches.       Objective:   Physical Exam BP (!) 150/75   Pulse 76   Temp 98 F (36.7 C) (Oral)   Wt 158 lb 9.6 oz (71.9 kg)   SpO2 97%   BMI 25.60 kg/m Vital signs and nurse's notes reviewed General-well-nourished, well-developed male in no acute distress ENT- TMs gray, nares with mild edema, mild posterior pharynx erythema Neck-supple, no lymphadenopathy, no thyromegaly, no carotid bruit Cardiovascular-regular rate and rhythm Abdomen soft, nontender Lungs-clear to auscultation bilaterally Back-no CVA tenderness Extremities-no edema Neuro-cranial nerves II through XII grossly intact      Assessment & Plan:  1. Urinary frequency Patient had urinalysis done at today's visit and follow-up of his urinary frequency.  Patient's urinalysis was negative for nitrites, leukocytes or blood.  No presence of ketones.  Patient was to have fingerstick glucose done however there were no test strips available.  Patient had hemoglobin A1c done which was slightly above normal  at 5.8 patient will have BMP in follow-up of his urinary frequency to look for elevated blood sugar, abnormal creatinine or abnormal electrolytes secondary to his frequent urination.  Patient will be placed on Septra DS twice daily x14 days in case of a prostatitis but patient has been asked to return next week for reevaluation and to discuss labs from today's visit. - POCT URINALYSIS DIP (CLINITEK) - HgB A1c - Basic Metabolic Panel - sulfamethoxazole-trimethoprim (BACTRIM DS,SEPTRA DS)  800-160 MG tablet; Take 1 tablet by mouth 2 (two) times daily for 10 days.  Dispense: 20 tablet; Refill: 0  2. Elevated blood pressure reading Patient's blood pressure was elevated at today's visit and I discussed with the patient that he likely has hypertension.  Patient is not yet ready to be placed on medication.  Blood pressure will be rechecked we will return for reevaluation next week.  3. Muscle cramping Patient with complaint of some recent leg discomfort and muscle cramping.  Patient will have BMP to look for electrolyte abnormalities such as low potassium of the cause of his muscle cramps. - Basic Metabolic Panel  4. Need for immunization against influenza Patient was offered and agreed to have influenza immunization done at today's visit.  Patient was also given informational handout regarding immunization. - Flu Vaccine QUAD 36+ mos IM

## 2018-04-18 LAB — BASIC METABOLIC PANEL WITH GFR
BUN/Creatinine Ratio: 14 (ref 9–20)
BUN: 11 mg/dL (ref 6–24)
CO2: 21 mmol/L (ref 20–29)
Calcium: 9.3 mg/dL (ref 8.7–10.2)
Chloride: 104 mmol/L (ref 96–106)
Creatinine, Ser: 0.77 mg/dL (ref 0.76–1.27)
GFR calc Af Amer: 125 mL/min/1.73
GFR calc non Af Amer: 108 mL/min/1.73
Glucose: 124 mg/dL — ABNORMAL HIGH (ref 65–99)
Potassium: 3.8 mmol/L (ref 3.5–5.2)
Sodium: 139 mmol/L (ref 134–144)

## 2018-05-12 ENCOUNTER — Ambulatory Visit: Payer: Self-pay | Admitting: Family Medicine

## 2019-05-28 ENCOUNTER — Other Ambulatory Visit: Payer: Self-pay

## 2019-05-28 DIAGNOSIS — Z20822 Contact with and (suspected) exposure to covid-19: Secondary | ICD-10-CM

## 2019-05-30 LAB — NOVEL CORONAVIRUS, NAA: SARS-CoV-2, NAA: NOT DETECTED

## 2021-05-06 ENCOUNTER — Emergency Department (HOSPITAL_COMMUNITY)
Admission: EM | Admit: 2021-05-06 | Discharge: 2021-05-06 | Disposition: A | Discharge status: Home / Self Care | Payer: Self-pay | Attending: Emergency Medicine | Admitting: Emergency Medicine

## 2021-05-06 ENCOUNTER — Other Ambulatory Visit: Payer: Self-pay

## 2021-05-06 ENCOUNTER — Encounter (HOSPITAL_COMMUNITY): Payer: Self-pay | Admitting: Emergency Medicine

## 2021-05-06 DIAGNOSIS — H6692 Otitis media, unspecified, left ear: Secondary | ICD-10-CM | POA: Insufficient documentation

## 2021-05-06 DIAGNOSIS — Z79899 Other long term (current) drug therapy: Secondary | ICD-10-CM | POA: Insufficient documentation

## 2021-05-06 DIAGNOSIS — I10 Essential (primary) hypertension: Secondary | ICD-10-CM | POA: Insufficient documentation

## 2021-05-06 DIAGNOSIS — H6690 Otitis media, unspecified, unspecified ear: Secondary | ICD-10-CM

## 2021-05-06 MED ORDER — AMOXICILLIN 500 MG PO CAPS: 500.0000 mg | ORAL_CAPSULE | Freq: Three times a day (TID) | ORAL | 0 refills | Status: AC

## 2021-05-06 NOTE — ED Triage Notes (Signed)
C/o L ear pain that started today.

## 2021-05-06 NOTE — ED Provider Notes (Signed)
[facility] EMERGENCY DEPARTMENT Provider Note   CSN: [id number] Arrival date & time: 05/06/21  1432     History Chief Complaint  Patient presents with   Ear Pain    Robert Mcintosh is a 50 y.o. male.  50 year old male with prior medical history as detailed below presents for evaluation.  Patient complains of left ear pain.  This is associated with mild decrease in left ear hearing.  He denies fever.  He denies any significant trauma.  He reports gradual onset of discomfort.  He is not take anything for his symptoms.  The history is provided by the patient.  Otalgia Location:  Left Behind ear:  No abnormality Quality:  Aching Severity:  Mild Onset quality:  Gradual Duration:  1 day Timing:  Constant Progression:  Worsening Chronicity:  New Context: not direct blow, not elevation change, not foreign body in ear, not loud noise, not recent URI and not water in ear   Relieved by:  Nothing Worsened by:  Nothing Ineffective treatments:  None tried     Past Medical History:  Diagnosis Date   Bilateral arm pain    Bilateral shoulder pain     Patient Active Problem List   Diagnosis Date Noted   Hyperlipidemia 03/15/2016   Rotator cuff tendinitis 03/15/2016   Chest pain 11/26/2015   Elevated troponin 11/26/2015   Bilateral arm pain 11/26/2015   HTN (hypertension) 11/26/2015    History reviewed. No pertinent surgical history.     Family History  Problem Relation Age of Onset   Ulcers Mother     Social History   Tobacco Use   Smoking status: Never   Smokeless tobacco: Never  Substance Use Topics   Alcohol use: No    Alcohol/week: 0.0 standard drinks   Drug use: No    Home Medications Prior to Admission medications   Medication Sig Start Date End Date Taking? Authorizing Provider  acetaminophen (TYLENOL) 500 MG tablet Take 2 tablets (1,000 mg total) by mouth every 6 (six) hours as needed. Patient not taking: Reported on 04/17/2018 12/06/16    Lizbeth Bark, FNP  atorvastatin (LIPITOR) 10 MG tablet Take 1 tablet (10 mg total) by mouth daily. Patient not taking: Reported on 04/17/2018 12/16/16   Lizbeth Bark, FNP  HYDROcodone-acetaminophen (NORCO/VICODIN) 5-325 MG tablet Take 1 tablet by mouth every 4 (four) hours as needed for moderate pain. Patient not taking: Reported on 03/15/2016 11/27/15   Dorothea Ogle, MD  meloxicam (MOBIC) 7.5 MG tablet Take 1 tablet (7.5 mg total) by mouth daily. Patient not taking: Reported on 04/17/2018 12/06/16   Lizbeth Bark, FNP    Allergies    Patient has no known allergies.  Review of Systems   Review of Systems  HENT:  Positive for ear pain.   All other systems reviewed and are negative.  Physical Exam Updated Vital Signs BP (!) 172/106 (BP Location: Left Arm)   Pulse 77   Temp 98.6 F (37 C) (Oral)   Resp 16   SpO2 97%   Physical Exam Vitals and nursing note reviewed.  Constitutional:      General: He is not in acute distress.    Appearance: Normal appearance. He is well-developed.  HENT:     Head: Normocephalic and atraumatic.     Right Ear: Tympanic membrane, ear canal and external ear normal.     Left Ear: Ear canal and external ear normal.     Ears:  Comments: Mildly erythematous left TM with moderate effusion posterior to the TM. Eyes:     Conjunctiva/sclera: Conjunctivae normal.     Pupils: Pupils are equal, round, and reactive to light.  Cardiovascular:     Rate and Rhythm: Normal rate.  Pulmonary:     Effort: Pulmonary effort is normal. No respiratory distress.  Abdominal:     General: There is no distension.     Palpations: Abdomen is soft.     Tenderness: There is no abdominal tenderness.  Musculoskeletal:        General: No deformity. Normal range of motion.     Cervical back: Normal range of motion and neck supple.  Skin:    General: Skin is warm.  Neurological:     General: No focal deficit present.     Mental Status: He is alert  and oriented to person, place, and time.    ED Results / Procedures / Treatments   Labs (all labs ordered are listed, but only abnormal results are displayed) Labs Reviewed - No data to display  EKG None  Radiology No results found.  Procedures Procedures   Medications Ordered in ED Medications - No data to display  ED Course  I have reviewed the triage vital signs and the nursing notes.  Pertinent labs & imaging results that were available during my care of the patient were reviewed by me and considered in my medical decision making (see chart for details).    MDM Rules/Calculators/A&P                           MDM  MSE complete  Robert Mcintosh was evaluated in Emergency Department on 05/06/2021 for the symptoms described in the history of present illness. He was evaluated in the context of the global COVID-19 pandemic, which necessitated consideration that the patient might be at risk for infection with the SARS-CoV-2 virus that causes COVID-19. Institutional protocols and algorithms that pertain to the evaluation of patients at risk for COVID-19 are in a state of rapid change based on information released by regulatory bodies including the CDC and federal and state organizations. These policies and algorithms were followed during the patient's care in the ED.   Patient is presenting with complaint of gradual onset of left ear pain and mild decrease in his hearing from that ear.  Exam is suggestive of early otitis media. Patient will be prescribed a course of outpatient antibiotics. He does understand need for close follow-up.  Strict return precautions given and understood.   Final Clinical Impression(s) / ED Diagnoses Final diagnoses:  Acute otitis media, unspecified otitis media type    Rx / DC Orders ED Discharge Orders          Ordered    amoxicillin (AMOXIL) 500 MG capsule  3 times daily        05/06/21 1705             Wynetta Fines,  MD 05/06/21 1706

## 2021-05-06 NOTE — Discharge Instructions (Addendum)
Return for any problem.   Take amoxicillin as prescribed.  Use Tylenol as needed for pain.

## 2022-03-24 ENCOUNTER — Telehealth: Payer: Self-pay | Admitting: Physician Assistant

## 2022-03-24 NOTE — Progress Notes (Signed)
The patient no-showed for appointment despite this provider sending direct link x 2 with no response and waiting for at least 10 minutes from appointment time for patient to join. They will be marked as a NS for this appointment/time.   Jenafer Winterton M Leean Amezcua, PA-C
